# Patient Record
Sex: Female | Born: 1950 | Race: White | Hispanic: No | Marital: Married | Smoking: Current every day smoker
Health system: Southern US, Community
[De-identification: ages and names within clinical notes are randomized; demographics above are authoritative.]

## PROBLEM LIST (undated history)

## (undated) DIAGNOSIS — E039 Hypothyroidism, unspecified: Secondary | ICD-10-CM

## (undated) DIAGNOSIS — I10 Essential (primary) hypertension: Secondary | ICD-10-CM

## (undated) DIAGNOSIS — R7309 Other abnormal glucose: Secondary | ICD-10-CM

## (undated) HISTORY — PX: WISDOM TOOTH EXTRACTION: SHX21

## (undated) HISTORY — PX: OTHER SURGICAL HISTORY: SHX169

## (undated) HISTORY — DX: Other abnormal glucose: R73.09

## (undated) HISTORY — DX: Essential (primary) hypertension: I10

## (undated) HISTORY — DX: Hypothyroidism, unspecified: E03.9

---

## 2003-07-27 ENCOUNTER — Encounter: Admission: RE | Admit: 2003-07-27 | Discharge: 2003-07-27 | Payer: Self-pay | Admitting: Otolaryngology

## 2003-07-27 ENCOUNTER — Encounter: Payer: Self-pay | Admitting: Otolaryngology

## 2003-07-29 ENCOUNTER — Ambulatory Visit (HOSPITAL_COMMUNITY): Admission: RE | Admit: 2003-07-29 | Discharge: 2003-07-30 | Payer: Self-pay | Admitting: Otolaryngology

## 2003-07-29 ENCOUNTER — Encounter: Payer: Self-pay | Admitting: Otolaryngology

## 2003-08-01 ENCOUNTER — Ambulatory Visit (HOSPITAL_COMMUNITY): Admission: RE | Admit: 2003-08-01 | Discharge: 2003-08-01 | Payer: Self-pay | Admitting: Otolaryngology

## 2003-08-01 ENCOUNTER — Encounter: Payer: Self-pay | Admitting: Otolaryngology

## 2004-12-23 HISTORY — PX: SALIVARY GLAND SURGERY: SHX768

## 2009-02-02 ENCOUNTER — Emergency Department (HOSPITAL_COMMUNITY): Admission: EM | Admit: 2009-02-02 | Discharge: 2009-02-02 | Payer: Self-pay | Admitting: Emergency Medicine

## 2009-02-03 ENCOUNTER — Encounter: Admission: RE | Admit: 2009-02-03 | Discharge: 2009-02-03 | Payer: Self-pay | Admitting: Internal Medicine

## 2009-02-03 ENCOUNTER — Telehealth: Payer: Self-pay | Admitting: Family Medicine

## 2009-02-03 ENCOUNTER — Ambulatory Visit: Payer: Self-pay | Admitting: Internal Medicine

## 2009-02-03 DIAGNOSIS — I10 Essential (primary) hypertension: Secondary | ICD-10-CM | POA: Insufficient documentation

## 2009-02-03 DIAGNOSIS — R7309 Other abnormal glucose: Secondary | ICD-10-CM

## 2009-02-04 LAB — CONVERTED CEMR LAB
Free T4: 0.8 ng/dL (ref 0.6–1.6)
Hgb A1c MFr Bld: 6.3 % — ABNORMAL HIGH (ref 4.6–6.0)
Total CK: 46 units/L (ref 7–177)

## 2009-02-06 ENCOUNTER — Encounter (INDEPENDENT_AMBULATORY_CARE_PROVIDER_SITE_OTHER): Payer: Self-pay | Admitting: *Deleted

## 2009-02-13 ENCOUNTER — Ambulatory Visit: Payer: Self-pay | Admitting: Internal Medicine

## 2009-02-13 DIAGNOSIS — F411 Generalized anxiety disorder: Secondary | ICD-10-CM | POA: Insufficient documentation

## 2009-02-13 DIAGNOSIS — R7309 Other abnormal glucose: Secondary | ICD-10-CM

## 2009-02-17 ENCOUNTER — Telehealth (INDEPENDENT_AMBULATORY_CARE_PROVIDER_SITE_OTHER): Payer: Self-pay | Admitting: *Deleted

## 2009-02-20 ENCOUNTER — Telehealth (INDEPENDENT_AMBULATORY_CARE_PROVIDER_SITE_OTHER): Payer: Self-pay | Admitting: *Deleted

## 2009-02-21 ENCOUNTER — Ambulatory Visit: Payer: Self-pay | Admitting: Internal Medicine

## 2009-02-21 DIAGNOSIS — R5383 Other fatigue: Secondary | ICD-10-CM

## 2009-02-21 DIAGNOSIS — R110 Nausea: Secondary | ICD-10-CM

## 2009-02-21 DIAGNOSIS — R5381 Other malaise: Secondary | ICD-10-CM

## 2009-02-21 DIAGNOSIS — R6883 Chills (without fever): Secondary | ICD-10-CM | POA: Insufficient documentation

## 2009-02-21 DIAGNOSIS — R198 Other specified symptoms and signs involving the digestive system and abdomen: Secondary | ICD-10-CM

## 2009-02-23 LAB — CONVERTED CEMR LAB
Albumin: 4.6 g/dL (ref 3.5–5.2)
Alkaline Phosphatase: 71 units/L (ref 39–117)
Basophils Relative: 0.2 % (ref 0.0–3.0)
Creatinine, Ser: 0.7 mg/dL (ref 0.4–1.2)
Eosinophils Absolute: 0.2 10*3/uL (ref 0.0–0.7)
Free T4: 0.8 ng/dL (ref 0.6–1.6)
HCT: 42.8 % (ref 36.0–46.0)
Hemoglobin: 14.8 g/dL (ref 12.0–15.0)
Lymphocytes Relative: 23.9 % (ref 12.0–46.0)
MCHC: 34.7 g/dL (ref 30.0–36.0)
Monocytes Absolute: 0.9 10*3/uL (ref 0.1–1.0)
Monocytes Relative: 10.3 % (ref 3.0–12.0)
Potassium: 3.7 meq/L (ref 3.5–5.1)
TSH: 4.75 microintl units/mL (ref 0.35–5.50)
WBC: 8.5 10*3/uL (ref 4.5–10.5)

## 2009-02-24 ENCOUNTER — Encounter (INDEPENDENT_AMBULATORY_CARE_PROVIDER_SITE_OTHER): Payer: Self-pay | Admitting: *Deleted

## 2009-02-24 ENCOUNTER — Telehealth (INDEPENDENT_AMBULATORY_CARE_PROVIDER_SITE_OTHER): Payer: Self-pay | Admitting: *Deleted

## 2009-02-28 ENCOUNTER — Telehealth: Payer: Self-pay | Admitting: Internal Medicine

## 2009-03-03 ENCOUNTER — Telehealth: Payer: Self-pay | Admitting: Internal Medicine

## 2009-04-17 ENCOUNTER — Telehealth: Payer: Self-pay | Admitting: Internal Medicine

## 2009-04-17 ENCOUNTER — Encounter: Payer: Self-pay | Admitting: Internal Medicine

## 2009-04-19 ENCOUNTER — Encounter: Payer: Self-pay | Admitting: Internal Medicine

## 2009-06-16 ENCOUNTER — Ambulatory Visit: Payer: Self-pay | Admitting: Internal Medicine

## 2009-06-19 ENCOUNTER — Encounter: Payer: Self-pay | Admitting: Internal Medicine

## 2009-06-27 ENCOUNTER — Encounter (INDEPENDENT_AMBULATORY_CARE_PROVIDER_SITE_OTHER): Payer: Self-pay | Admitting: *Deleted

## 2009-07-22 IMAGING — CR DG CHEST 2V
2 series · 2 of 2 positions shown · non-contrast
Comparison: None

CLINICAL DATA: Chest pain.

CHEST - 2 VIEW

[w chest pa]
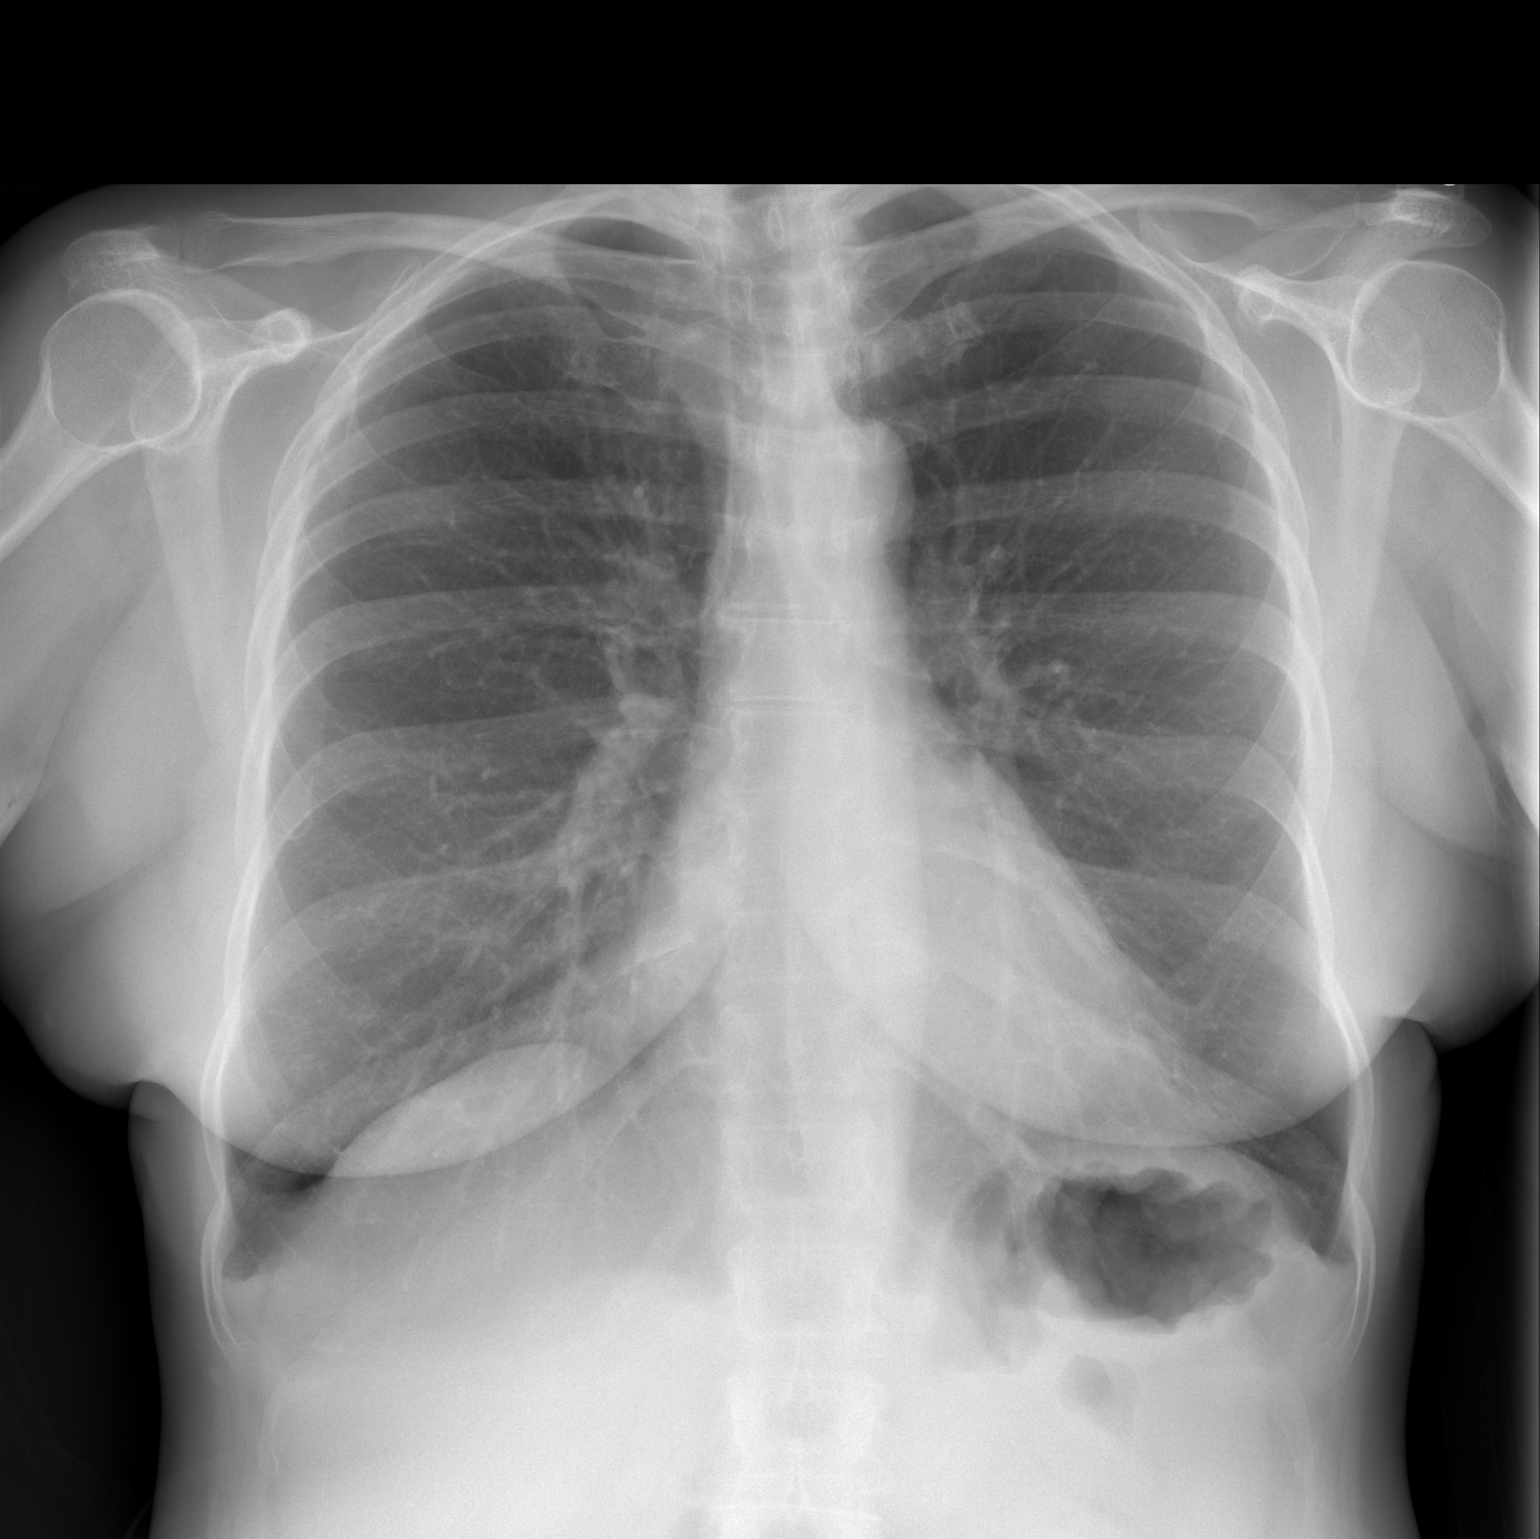

[w chest lat]
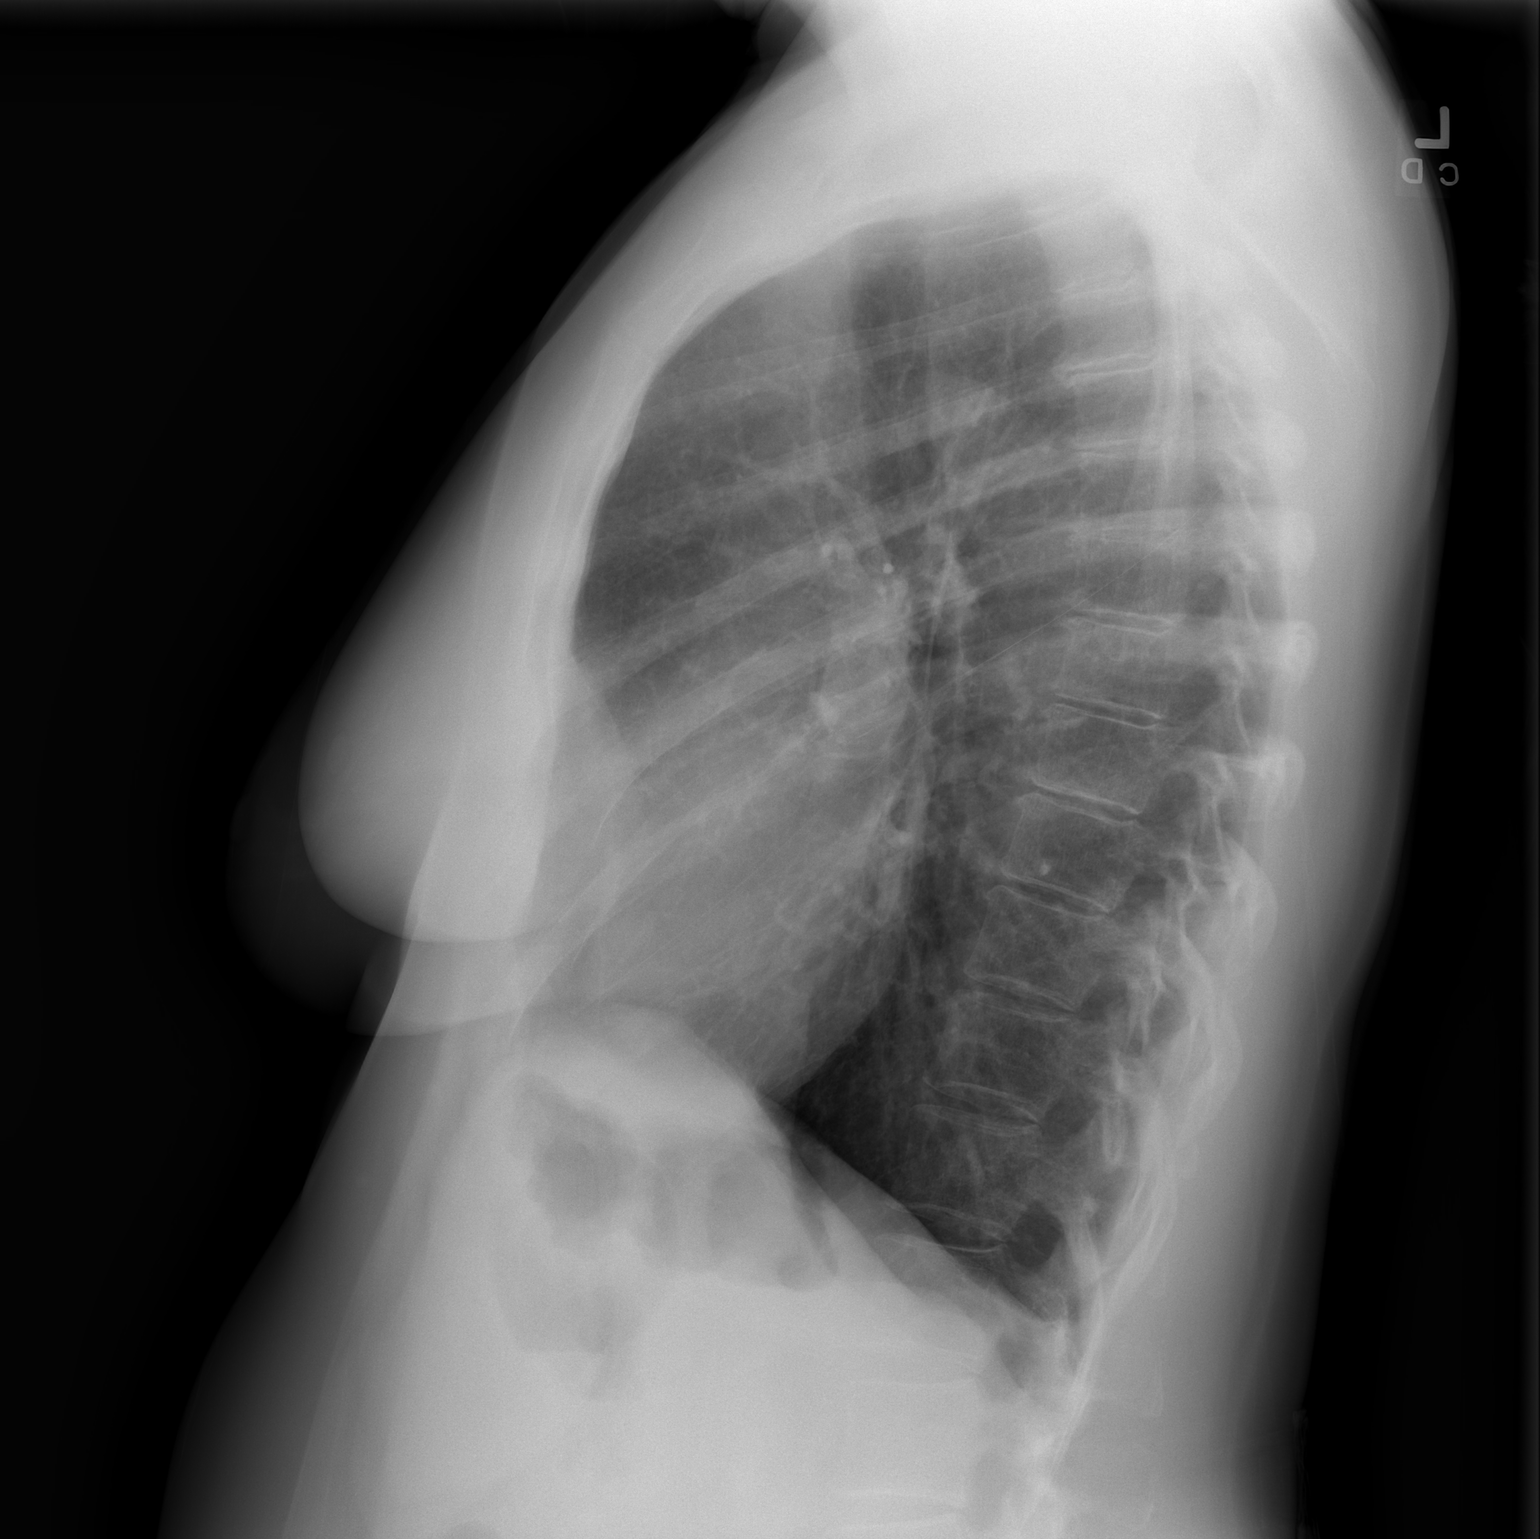

[2 of 2 positions shown; findings below may reference images not displayed]

FINDINGS: Heart and mediastinal contours are within normal limits.
No focal opacities or effusions.  No acute bony abnormality.
IMPRESSION: No active disease.

REF:Z9 DICTATED: 02/02/2009 [DATE]

## 2009-07-23 IMAGING — CT CT ANGIO CHEST
3 of 5 series · 19 of 36 positions shown · IV contrast (CONTRAST)
Comparison: Chest x-ray dated 02/02/2009

CLINICAL DATA: Chest pain.

CT ANGIOGRAPHY CHEST
TECHNIQUE: Multidetector CT imaging of the chest using the
standard protocol during bolus administration of intravenous
contrast. Multiplanar reconstructed images including MIPs were
obtained and reviewed to evaluate the vascular anatomy.
Contrast: 125 ml Optiray 350

[Series 6: arterial 2x2 · axial · arterial · 0.68mm/px · z∈[+919,+1124]mm · 15 of 94 slices shown]
[im 6/94  lung]
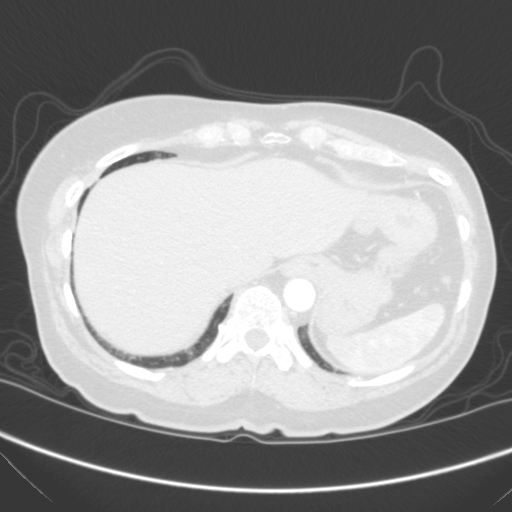
[im 12/94  mediastinal]
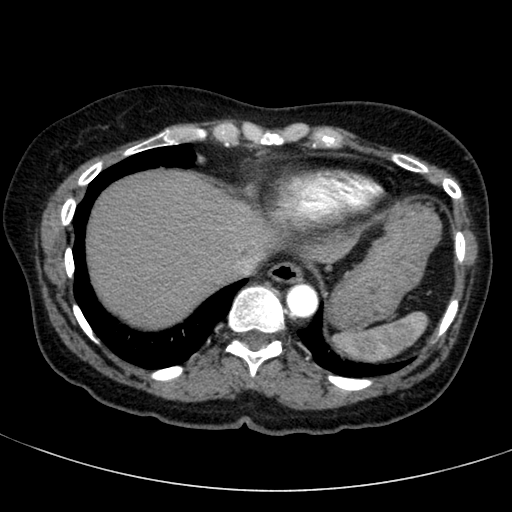
[im 18/94  lung]
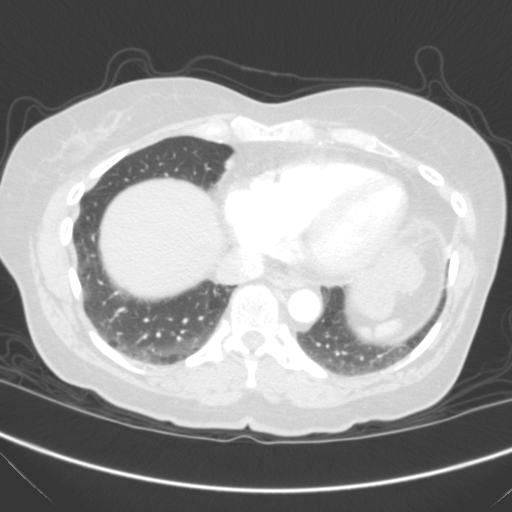
[im 24/94  mediastinal]
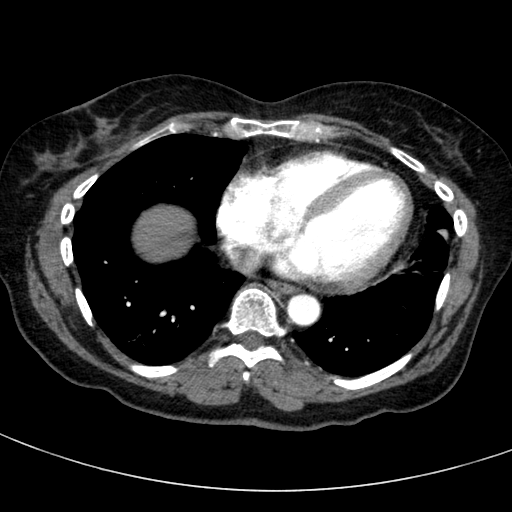
[im 30/94  lung]
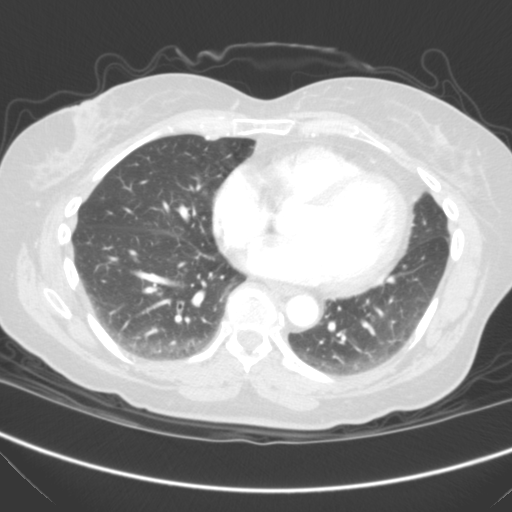
[im 35/94  mediastinal]
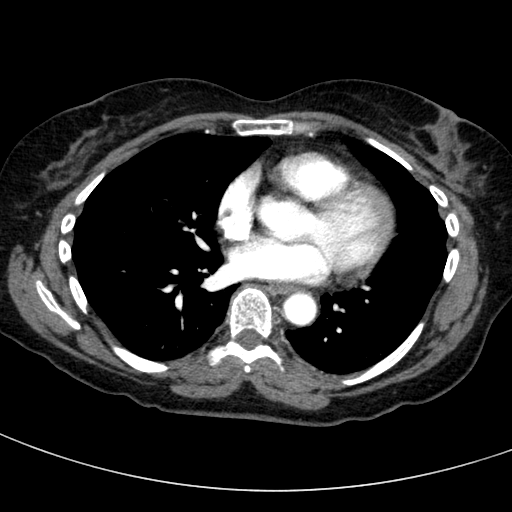
[im 41/94  lung]
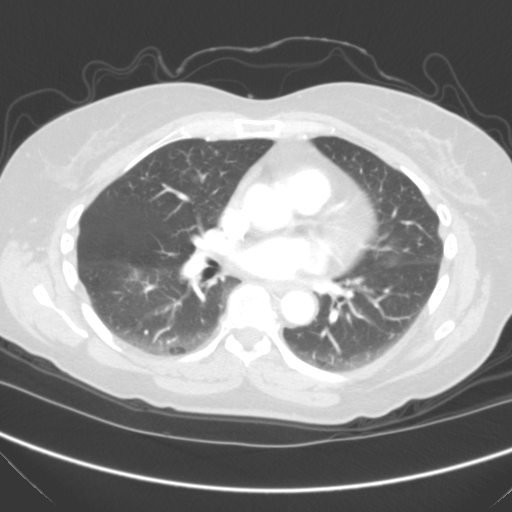
[im 47/94  mediastinal]
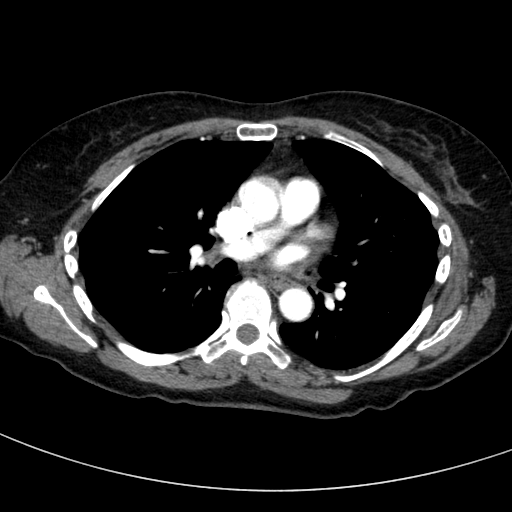
[im 53/94  lung]
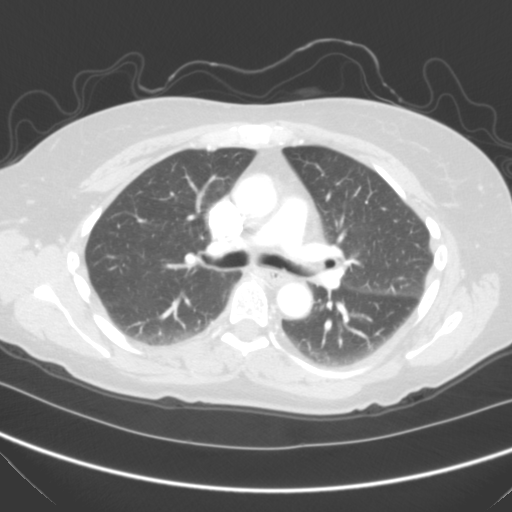
[im 59/94  mediastinal]
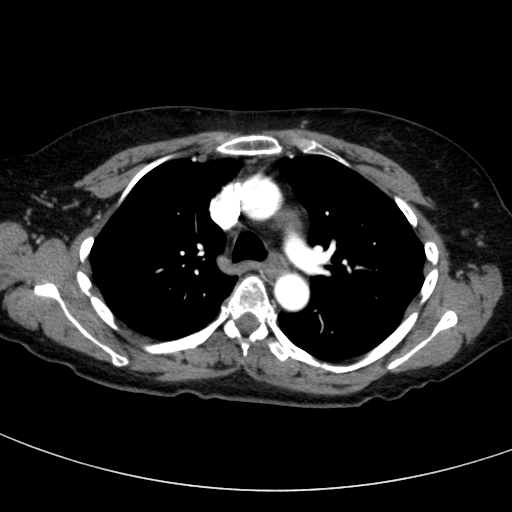
[im 64/94  lung]
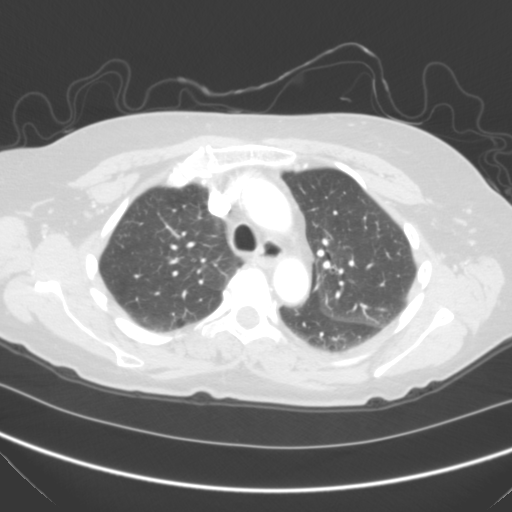
[im 70/94  mediastinal]
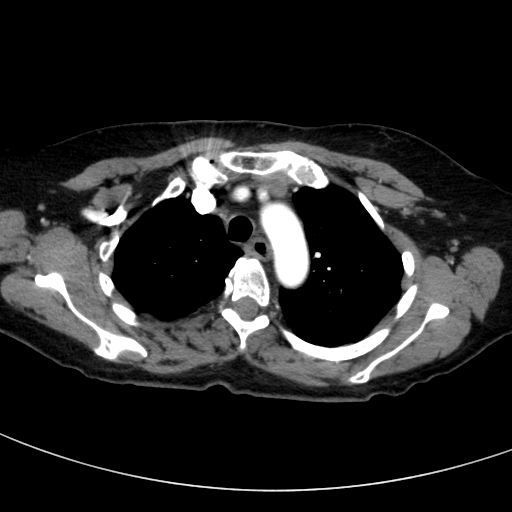
[im 76/94  lung]
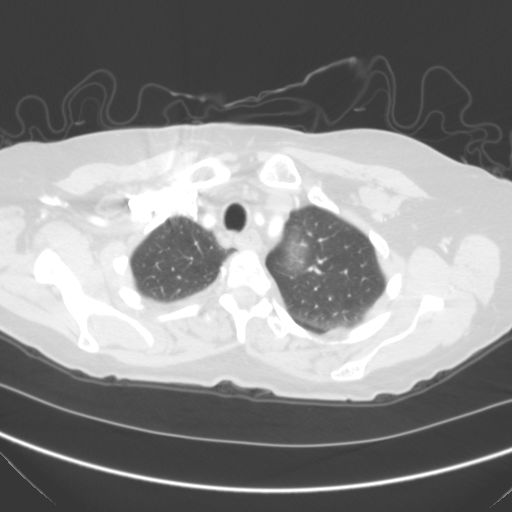
[im 82/94  mediastinal]
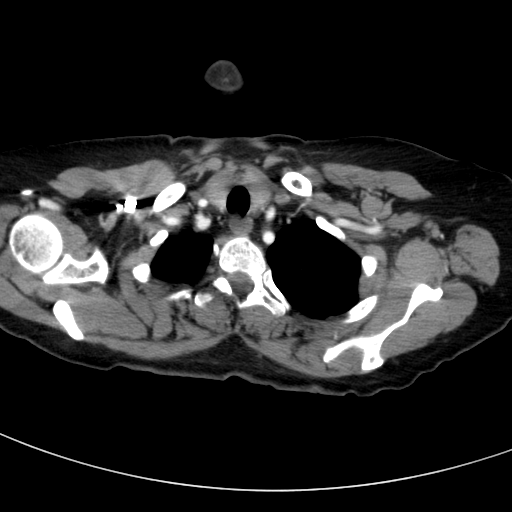
[im 88/94  lung]
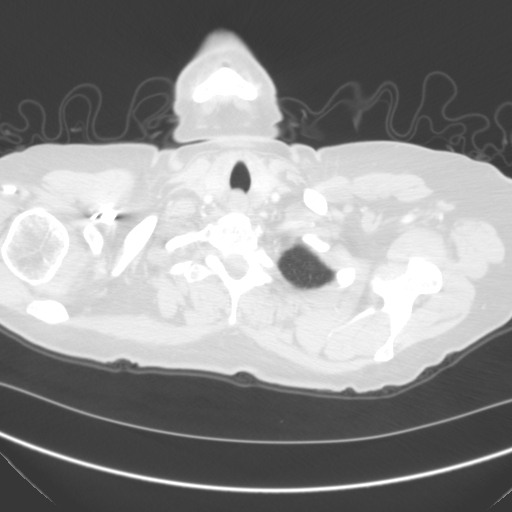

[Series 8: lung 5x5 · axial · 0.68mm/px · 1 of 47 slices shown]
[im 7/47  mediastinal]
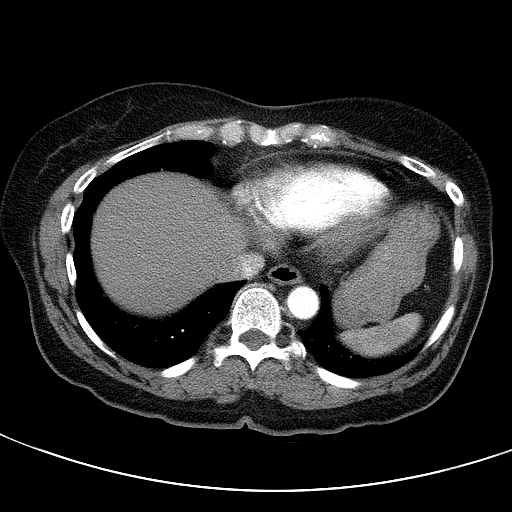

[coronals · coronal · 0.68mm/px · 3 of 84 slices shown]
[im 17/84  mediastinal]
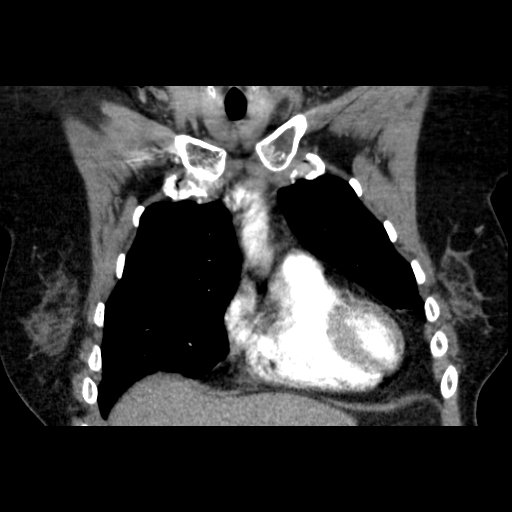
[im 34/84  mediastinal]
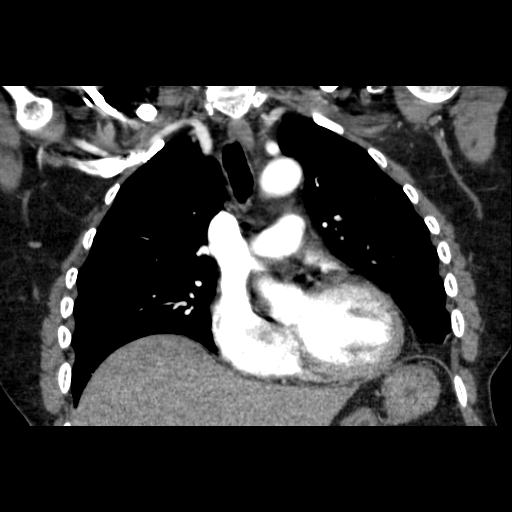
[im 50/84  mediastinal]
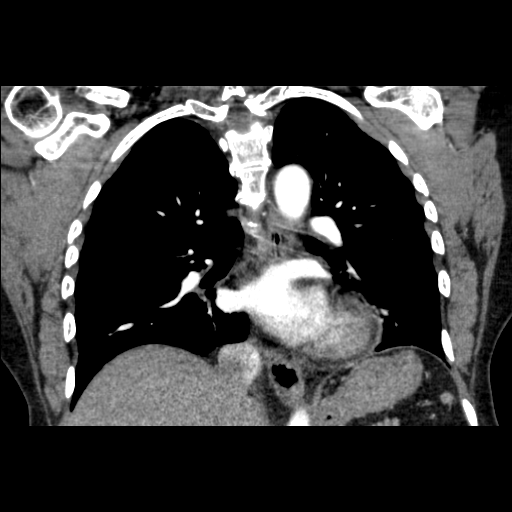

[19 of 36 positions shown; findings below may reference images not displayed]

FINDINGS: There is no evidence of pulmonary emboli, infiltrates,
effusions, or other acute abnormalities.  There are a few small
peripheral blebs in the posterior aspects of both lower lobes.

Heart size is normal.  The visualized portion of the upper abdomen
is normal.  No bony abnormality.
IMPRESSION: No significant abnormality.

REF:G3 DICTATED: 02/03/2009 [DATE]

## 2009-08-03 ENCOUNTER — Telehealth (INDEPENDENT_AMBULATORY_CARE_PROVIDER_SITE_OTHER): Payer: Self-pay | Admitting: *Deleted

## 2009-10-27 ENCOUNTER — Encounter: Payer: Self-pay | Admitting: Internal Medicine

## 2009-10-31 ENCOUNTER — Telehealth (INDEPENDENT_AMBULATORY_CARE_PROVIDER_SITE_OTHER): Payer: Self-pay | Admitting: *Deleted

## 2009-11-30 ENCOUNTER — Telehealth (INDEPENDENT_AMBULATORY_CARE_PROVIDER_SITE_OTHER): Payer: Self-pay | Admitting: *Deleted

## 2009-12-08 ENCOUNTER — Ambulatory Visit: Payer: Self-pay | Admitting: Internal Medicine

## 2009-12-10 LAB — CONVERTED CEMR LAB: Hgb A1c MFr Bld: 5.9 % (ref 4.6–6.5)

## 2009-12-11 ENCOUNTER — Encounter (INDEPENDENT_AMBULATORY_CARE_PROVIDER_SITE_OTHER): Payer: Self-pay | Admitting: *Deleted

## 2010-01-30 ENCOUNTER — Ambulatory Visit: Payer: Self-pay | Admitting: Internal Medicine

## 2010-01-30 DIAGNOSIS — K3189 Other diseases of stomach and duodenum: Secondary | ICD-10-CM | POA: Insufficient documentation

## 2010-01-30 DIAGNOSIS — R1013 Epigastric pain: Secondary | ICD-10-CM

## 2010-01-30 DIAGNOSIS — M545 Low back pain: Secondary | ICD-10-CM | POA: Insufficient documentation

## 2010-02-01 LAB — CONVERTED CEMR LAB
Basophils Absolute: 0 10*3/uL (ref 0.0–0.1)
Eosinophils Absolute: 0.1 10*3/uL (ref 0.0–0.7)
HCT: 45 % (ref 36.0–46.0)
Lymphs Abs: 1.4 10*3/uL (ref 0.7–4.0)
MCHC: 32.8 g/dL (ref 30.0–36.0)
Monocytes Relative: 9.1 % (ref 3.0–12.0)
Platelets: 326 10*3/uL (ref 150.0–400.0)
RDW: 12.5 % (ref 11.5–14.6)

## 2010-03-01 ENCOUNTER — Telehealth (INDEPENDENT_AMBULATORY_CARE_PROVIDER_SITE_OTHER): Payer: Self-pay | Admitting: *Deleted

## 2010-05-03 ENCOUNTER — Telehealth (INDEPENDENT_AMBULATORY_CARE_PROVIDER_SITE_OTHER): Payer: Self-pay | Admitting: *Deleted

## 2010-07-02 ENCOUNTER — Telehealth (INDEPENDENT_AMBULATORY_CARE_PROVIDER_SITE_OTHER): Payer: Self-pay | Admitting: *Deleted

## 2010-07-05 ENCOUNTER — Telehealth (INDEPENDENT_AMBULATORY_CARE_PROVIDER_SITE_OTHER): Payer: Self-pay | Admitting: *Deleted

## 2010-10-02 ENCOUNTER — Telehealth (INDEPENDENT_AMBULATORY_CARE_PROVIDER_SITE_OTHER): Payer: Self-pay | Admitting: *Deleted

## 2010-12-23 HISTORY — PX: CATARACT EXTRACTION: SUR2

## 2011-01-20 LAB — CONVERTED CEMR LAB
BUN: 13 mg/dL (ref 6–23)
Creatinine,U: 101.7 mg/dL
Hgb A1c MFr Bld: 6.1 % (ref 4.6–6.5)
Microalb, Ur: 0.6 mg/dL (ref 0.0–1.9)
Potassium: 3.9 meq/L (ref 3.5–5.1)

## 2011-01-22 NOTE — Progress Notes (Signed)
Summary: LORAZEPAM REFILL  Phone Note Refill Request Message from:  Fax from Pharmacy on October 02, 2010 1:03 PM  Refills Requested: Medication #1:  LORAZEPAM 1 MG TABS 1/2 -1 q 8 -12 hrs as needed stress   [facility] [address] [place]   FAX [phone]  Initial call taken by: Jerolyn Shin,  October 02, 2010 1:03 PM    Prescriptions: LORAZEPAM 1 MG TABS (LORAZEPAM) 1/2 -1 q 8 -12 hrs as needed stress  #30 x 0   Entered by:   Shonna Chock CMA   Authorized by:   Marga Melnick MD   Signed by:   Shonna Chock CMA on 10/02/2010   Method used:   Printed then faxed to ...       [facility]  [phone]* (retail)       [address] [place], [place]  [address]       Ph: [phone] or [phone]       Fax: [phone]   RxID:   [id number]

## 2011-01-22 NOTE — Progress Notes (Signed)
Summary: Refill Request  Phone Note Refill Request Call back at [phone] Message from:  Pharmacy on July 05, 2010 7:52 AM  Refills Requested: Medication #1:  LORAZEPAM 1 MG TABS 1/2 -1 q 8 -12 hrs as needed stress   Dosage confirmed as above?Dosage Confirmed   Supply Requested: 1 month   [facility] [address]  Next Appointment Scheduled: none Initial call taken by: Lavell Islam,  July 05, 2010 7:53 AM    Prescriptions: LORAZEPAM 1 MG TABS (LORAZEPAM) 1/2 -1 q 8 -12 hrs as needed stress  #30 x 0   Entered by:   Shonna Chock CMA   Authorized by:   Marga Melnick MD   Signed by:   Shonna Chock CMA on 07/05/2010   Method used:   Printed then faxed to ...       [facility]  [phone]* (retail)       [address] [place], [place]  [address]       Ph: [phone] or [phone]       Fax: [phone]   RxID:   [id number]

## 2011-01-22 NOTE — Progress Notes (Signed)
Summary: losartan refill   Phone Note Refill Request Message from:  Fax from Pharmacy on July 02, 2010 4:16 PM  Refills Requested: Medication #1:  LOSARTAN POTASSIUM 100 MG TABS 1 by mouth once daily [facility] - fax [phone] - ph [phone]  Initial call taken by: Okey Regal Spring,  July 02, 2010 4:17 PM    Prescriptions: LOSARTAN POTASSIUM 100 MG TABS (LOSARTAN POTASSIUM) 1 by mouth once daily  #30 Tablet x 4   Entered by:   Doristine Devoid CMA   Authorized by:   Marga Melnick MD   Signed by:   Doristine Devoid CMA on 07/03/2010   Method used:   Electronically to        [facility]  [phone]* (retail)       [address] [place], [place]  [address]       Ph: [phone] or [phone]       Fax: [phone]   RxID:   [id number]

## 2011-01-22 NOTE — Progress Notes (Signed)
Summary: Refill Request  Phone Note Refill Request Call back at [phone] Message from:  Pharmacy on May 03, 2010 2:13 PM  Refills Requested: Medication #1:  LORAZEPAM 1 MG TABS 1/2 -1 q 8 -12 hrs as needed stress   Dosage confirmed as above?Dosage Confirmed   Supply Requested: 1 month   [facility]  Next Appointment Scheduled: none Initial call taken by: Harold Barban,  May 03, 2010 2:13 PM    Prescriptions: LORAZEPAM 1 MG TABS (LORAZEPAM) 1/2 -1 q 8 -12 hrs as needed stress  #30 x 0   Entered by:   Shonna Chock   Authorized by:   Marga Melnick MD   Signed by:   Shonna Chock on 05/03/2010   Method used:   Printed then faxed to ...       [facility]  [phone]* (retail)       [address] [place], [place]  [address]       Ph: [phone] or [phone]       Fax: [phone]   RxID:   [id number]

## 2011-01-22 NOTE — Assessment & Plan Note (Signed)
Summary: PAIN ON LEFT SIDE/KDC   Vital Signs:  Patient profile:   60 year old female Weight:      173.8 pounds Temp:     99.2 degrees F oral Pulse rate:   80 / minute Resp:     17 per minute BP sitting:   116 / 70  (left arm) Cuff size:   large  Vitals Entered By: Shonna Chock (January 30, 2010 12:18 PM) CC: Left side lower back/hip pain since Saturday. No appetite x 24hours and more gas/indigestion x 1 year Comments REVIEWED MED LIST, PATIENT AGREED DOSE AND INSTRUCTION CORRECT    CC:  Left side lower back/hip pain since Saturday. No appetite x 24hours and more gas/indigestion x 1 year.  History of Present Illness: Onset L LS area dull , burning  pain upon awakening 01/27/2010; slight radiation to L groin , initially intermittent but constant as of 02/06. Rx: heating pad, NSAIDS w/o help. Tramadol X 2  helped; she forgot she had it.No  PMH of back injury or issues. Dyspepsia X 1 year ; worse since 02/04 before Advil taken.WV:PXTGGY helped. No PMH of colonoscopy; "I never had a doctor until last year". No FH of GI disease. No Gyn monitor for 8 years.  Allergies: 1)  ! Celexa (Citalopram Hydrobromide)  Review of Systems General:  Complains of chills; denies fever and sweats. GI:  Complains of constipation, loss of appetite, and nausea; denies bloody stools, dark tarry stools, diarrhea, vomiting, and yellowish skin color; No  light stool ; no bloating. GU:  Denies abnormal vaginal bleeding, discharge, dysuria, and hematuria; No coke colored urine. MS:  Complains of joint pain and low back pain; denies joint redness and joint swelling. Derm:  Denies lesion(s) and rash. Neuro:  Denies brief paralysis, numbness, tingling, and weakness. Heme:  Denies abnormal bruising and bleeding.  Physical Exam  General:  well-nourished,in no acute distress; alert,appropriate and cooperative throughout examination; appears mildly uncomfortable-appearing.   Eyes:  No corneal or conjunctival  inflammation noted. No icterus.  Mouth:  Oral mucosa and oropharynx without lesions or exudates.  Teeth in good repair.No pharyngeal erythema.  Osteoma of hard palate Heart:  Normal rate and regular rhythm. S1 and S2 normal without gallop, murmur, click, rub. S4 Abdomen:  Bowel sounds positive,abdomen soft  slightly tender  LLQ without masses, organomegaly or hernias noted.No AAA Rectal:  Stool cards given Genitalia:  Gyn F/U recommended Msk:  No flank tenderness; sat up w/o help Extremities:  Neg SLR  but some discomfort L LS area; no arthritic hand changes Neurologic:  alert & oriented X3, strength normal in all extremities, heel & tiptoe gait normal, and DTRs symmetrical and normal.   Skin:  Intact without suspicious lesions or rashes. No jaundice Cervical Nodes:  No lymphadenopathy noted Axillary Nodes:  No palpable lymphadenopathy Psych:  Oriented X3, normally interactive, and good eye contact.     Impression & Recommendations:  Problem # 1:  LOW BACK PAIN, ACUTE (ICD-724.2)  Her updated medication list for this problem includes:    Tramadol Hcl 50 Mg Tabs (Tramadol hcl) .Marland Kitchen... 1-2 q 4-6 hrs as needed    Cyclobenzaprine Hcl 5 Mg Tabs (Cyclobenzaprine hcl) .Marland Kitchen... 1-2 at bedtime for back pain  Orders: Venipuncture (69485) TLB-CBC Platelet - w/Differential (85025-CBCD) TLB-Udip ONLY (81003-UDIP)  Problem # 2:  DYSPEPSIA (ICD-536.8)  Orders: Venipuncture (46270) TLB-CBC Platelet - w/Differential (85025-CBCD)  Complete Medication List: 1)  Losartan Potassium 100 Mg Tabs (Losartan potassium) .Marland Kitchen.. 1 by mouth  once daily 2)  Tramadol Hcl 50 Mg Tabs (Tramadol hcl) .Marland Kitchen.. 1-2 q 4-6 hrs as needed 3)  Lorazepam 1 Mg Tabs (Lorazepam) .... 1/2 -1 q 8 -12 hrs as needed stress 4)  Cyclobenzaprine Hcl 5 Mg Tabs (Cyclobenzaprine hcl) .Marland Kitchen.. 1-2 at bedtime for back pain  Patient Instructions: 1)  Avoid foods high in acid (tomatoes, citrus juices, spicy foods). Avoid eating within two hours of  lying down or before exercising. Do not over eat; try smaller more frequent meals. Elevate head of bed twelve inches when sleeping. Complete stool cards. Prilosec OTC 30 minutes pre b'fast. 2)  Please consider scheduling  a colonoscopy  to help detect colon cancer. Please consider Gyn survelliance; consider Dr Rosalio Macadamia 's group for example Prescriptions: CYCLOBENZAPRINE HCL 5 MG TABS (CYCLOBENZAPRINE HCL) 1-2 at bedtime for back pain  #20 x 0   Entered and Authorized by:   Marga Melnick MD   Signed by:   Marga Melnick MD on 01/30/2010   Method used:   Print then Give to Patient   RxID:   [id number] TRAMADOL HCL 50 MG TABS (TRAMADOL HCL) 1-2 q 4-6 hrs as needed  #30 x 2   Entered and Authorized by:   Marga Melnick MD   Signed by:   Marga Melnick MD on 01/30/2010   Method used:   Print then Give to Patient   RxID:   [id number]   Appended Document: PAIN ON LEFT SIDE/KDC  Laboratory Results   Urine Tests   Date/Time Reported: January 30, 2010 1:23 PM   Routine Urinalysis   Color: yellow Appearance: Clear Glucose: negative   (Normal Range: Negative) Bilirubin: negative   (Normal Range: Negative) Ketone: negative   (Normal Range: Negative) Spec. Gravity: <1.005   (Normal Range: 1.003-1.035) Blood: moderate   (Normal Range: Negative) pH: 5.0   (Normal Range: 5.0-8.0) Protein: negative   (Normal Range: Negative) Urobilinogen: 0.2   (Normal Range: 0-1) Nitrite: negative   (Normal Range: Negative) Leukocyte Esterace: negative   (Normal Range: Negative)    Comments: Floydene Flock  January 30, 2010 1:24 PM cx sent

## 2011-01-22 NOTE — Progress Notes (Signed)
Summary: refill  Phone Note Refill Request Message from:  Fax from Pharmacy on March 01, 2010 12:11 PM  Refills Requested: Medication #1:  LORAZEPAM 1 MG TABS 1/2 -1 q 8 -12 hrs as needed stress [facility] [phone]   Method Requested: Fax to Local Pharmacy Next Appointment Scheduled: no appt Initial call taken by: Barb Merino,  March 01, 2010 12:11 PM    Prescriptions: LORAZEPAM 1 MG TABS (LORAZEPAM) 1/2 -1 q 8 -12 hrs as needed stress  #30 x 0   Entered by:   Shonna Chock   Authorized by:   Marga Melnick MD   Signed by:   Shonna Chock on 03/01/2010   Method used:   Printed then faxed to ...       [facility]  [phone]* (retail)       [address] [place], [place]  [address]       Ph: [phone] or [phone]       Fax: [phone]   RxID:   [id number]

## 2011-01-29 ENCOUNTER — Telehealth (INDEPENDENT_AMBULATORY_CARE_PROVIDER_SITE_OTHER): Payer: Self-pay | Admitting: *Deleted

## 2011-01-31 ENCOUNTER — Encounter (INDEPENDENT_AMBULATORY_CARE_PROVIDER_SITE_OTHER): Payer: Self-pay | Admitting: *Deleted

## 2011-02-07 NOTE — Letter (Signed)
Summary: Primary Care Appointment Letter  [facility] [place]  [address] [place], [place] [address]   Phone: [phone]  Fax: [phone]    01/31/2011 MRN: [id number]  Brandi Todd [address] [place], [place]  [address]  Dear Brandi Todd,   Your Primary Care Physician Marga Melnick MD has indicated that:    ___X____it is time to schedule an appointment(Please call to schedule a yearly appointment) This is necessary to continue refilling meds .    _______you missed your appointment on______ and need to call and          reschedule.    _______you need to have lab work done.    _______you need to schedule an appointment discuss lab or test results.    _______you need to call to reschedule your appointment that is                       scheduled on _________.     Please call our office as soon as possible. Our phone number is [phone]. Please press option 1. Our office is open 8a-5p, Monday through Friday.     Thank you,    [facility] Scheduler

## 2011-02-07 NOTE — Letter (Signed)
Summary: Results Follow up Letter  [facility] [place]  [address] [place], [place] [address]   Phone: [phone]  Fax: [phone]    01/31/2011 MRN: [id number]  Brandi Todd [address] [place], [place]  [address]  Dear Brandi Todd,  The following are the results of your recent test(s):  Test         Result    Pap Smear:        Normal _____  Not Normal _____ Comments: ______________________________________________________ Cholesterol: LDL(Bad cholesterol):         Your goal is less than:         HDL (Good cholesterol):       Your goal is more than: Comments:  ______________________________________________________ Mammogram:        Normal _____  Not Normal _____ Comments:  ___________________________________________________________________ Hemoccult:        Normal __X___  Not normal _______ Comments:    _____________________________________________________________________ Other Tests:    We routinely do not discuss normal results over the telephone.  If you desire a copy of the results, or you have any questions about this information we can discuss them at your next office visit.   Sincerely,     Appended Document: Results Follow up Letter Note voided, created in error

## 2011-02-07 NOTE — Progress Notes (Signed)
Summary: refill  Phone Note Refill Request Message from:  Fax from Pharmacy on January 29, 2011 2:27 PM  Refills Requested: Medication #1:  LORAZEPAM 1 MG TABS 1/2 -1 q 8 -12 hrs as needed stress [facility] fax [phone] -- phone [phone]  Initial call taken by: Okey Regal Spring,  January 29, 2011 2:28 PM    New/Updated Medications: LORAZEPAM 1 MG TABS (LORAZEPAM) 1/2 -1 q 8 -12 hrs as needed stress**APPOINTMENT DUE** Prescriptions: LORAZEPAM 1 MG TABS (LORAZEPAM) 1/2 -1 q 8 -12 hrs as needed stress**APPOINTMENT DUE**  #30 x 0   Entered by:   Shonna Chock CMA   Authorized by:   Marga Melnick MD   Signed by:   Shonna Chock CMA on 01/29/2011   Method used:   Printed then faxed to ...       [facility]  [phone]* (retail)       [address] [place], [place]  [address]       Ph: [phone] or [phone]       Fax: [phone]   RxID:   [id number]

## 2011-02-27 ENCOUNTER — Encounter: Payer: Self-pay | Admitting: Internal Medicine

## 2011-04-09 LAB — COMPREHENSIVE METABOLIC PANEL
ALT: 17 U/L (ref 0–35)
AST: 17 U/L (ref 0–37)
Albumin: 4.6 g/dL (ref 3.5–5.2)
Alkaline Phosphatase: 77 U/L (ref 39–117)
Potassium: 3.3 mEq/L — ABNORMAL LOW (ref 3.5–5.1)
Sodium: 136 mEq/L (ref 135–145)
Total Protein: 7.3 g/dL (ref 6.0–8.3)

## 2011-04-09 LAB — CBC
Hemoglobin: 14.6 g/dL (ref 12.0–15.0)
Platelets: 369 10*3/uL (ref 150–400)
RDW: 13.1 % (ref 11.5–15.5)

## 2011-04-09 LAB — DIFFERENTIAL
Basophils Relative: 0 % (ref 0–1)
Eosinophils Absolute: 0.1 10*3/uL (ref 0.0–0.7)
Monocytes Absolute: 0.9 10*3/uL (ref 0.1–1.0)
Monocytes Relative: 8 % (ref 3–12)

## 2011-04-09 LAB — TSH: TSH: 2.588 u[IU]/mL (ref 0.350–4.500)

## 2011-04-12 ENCOUNTER — Encounter: Payer: Self-pay | Admitting: Internal Medicine

## 2011-04-12 ENCOUNTER — Ambulatory Visit (INDEPENDENT_AMBULATORY_CARE_PROVIDER_SITE_OTHER): Payer: BC Managed Care – PPO | Admitting: Internal Medicine

## 2011-04-12 DIAGNOSIS — Z0000 Encounter for general adult medical examination without abnormal findings: Secondary | ICD-10-CM

## 2011-04-12 DIAGNOSIS — Z23 Encounter for immunization: Secondary | ICD-10-CM

## 2011-04-12 DIAGNOSIS — R7309 Other abnormal glucose: Secondary | ICD-10-CM

## 2011-04-12 DIAGNOSIS — I10 Essential (primary) hypertension: Secondary | ICD-10-CM

## 2011-04-12 MED ORDER — TETANUS-DIPHTH-ACELL PERTUSSIS 5-2.5-18.5 LF-MCG/0.5 IM SUSP
0.5000 mL | Freq: Once | INTRAMUSCULAR | Status: AC
  Administered 2011-04-12: 0.5 mL via INTRAMUSCULAR

## 2011-04-12 NOTE — Assessment & Plan Note (Addendum)
Controlled with diet & exercise; A1c dropped from 6.3 to 5.9%

## 2011-04-12 NOTE — Patient Instructions (Addendum)
Preventive Health Care: Exercise  30-45  minutes a day, 3-4 days a week. Walking is especially valuable in preventing Osteoporosis. Eat a low-fat diet with lots of fruits and vegetables, up to 7-9 servings per day. Avoid obesity; your goal = waist less than 35 inches.Consume less than 30 grams of sugar per day from foods & drinks with High Fructose Corn Syrup as #2,3 or #4 on label. Eye Doctor - have an eye exam @ least annually Alcohol If you drink, do it moderately - 1 drink per day or less.  Depression is common in our stressful world. If you're feeling down or losing interest in things you normally enjoy, please call. Please think about quitting smoking. Review the risks we discussed. Please call 1-800-QUIT-NOW (782)504-3700) for free smoking cessation counseling.   Please have the dental caries addressed; this could be a threat to your health long term. Please consider a screening colonoscopy based on the standard of care we discussed. I do recommend followup with the gynecologist at least every 2 years. Monthly self exams and annual mammograms would be recommended.  Please schedule the following fasting labs: BMET, CBC and differential, hepatic panel, lipids, TSH, and A1 C. Please complete stool cards.  The EKG does not show significant hypertensive changes. It does show premature ventricular contractions. You should avoid excess stimulants such as decongestants, diet pills, nicotine, or caffeine ( coffee, tea, cola,chocolate).

## 2011-04-12 NOTE — Progress Notes (Signed)
Subjective:    Patient ID: Brandi Todd, female    DOB: 1951-06-10, 60 y.o.   MRN: [id number]  HPI She is here for a physical; she has has some seasonally related symptoms.    Review of Systems Patient reports no vision/ hearing  changes, adenopathy,fever, weight change,  persistant / recurrent hoarseness , swallowing issues, chest pain,palpitations,edema,persistant /recurrent cough, hemoptysis, dyspnea( rest/ exertional/paroxysmal nocturnal), gastrointestinal bleeding(melena, rectal bleeding), abdominal pain, significant heartburn, bowel changes,GU symptoms(dysuria, hematuria,pyuria, incontinence ), Gyn symptoms(abnormal  bleeding , pain),  syncope, focal weakness, memory loss,numbness & tingling, skin/hair /nail changes,abnormal bruising or bleeding, anxiety,or depression.   She describes overactive bowel sounds his "growling" but w/o other GI symptoms as noted above.  She is not completing surveillance and preventive care such as mammograms. She's not had a colonoscopy. The standard of care was reviewed.     Objective:   Physical Exam Gen.: Healthy and well-nourished in appearance. Alert, appropriate and cooperative throughout exam. Head: Normocephalic without obvious abnormalities Eyes: No corneal or conjunctival inflammation noted. Pupils equal round reactive to light and accommodation. Fundal exam is benign without hemorrhages, exudate, papilledema. Extraocular motion intact.  OD  Vision &  Light reflex  grossly decreased . Ears: External  ear exam reveals no significant lesions or deformities. Canals clear .TMs normal. Hearing is grossly normal bilaterally. Nose: External nasal exam reveals no deformity or inflammation. Nasal mucosa are pink and moist. No lesions or exudates noted.  Mouth: Oral mucosa and oropharynx reveal no lesions or exudates. Teeth in poor repair; molar caries. Neck: No deformities, masses, or tenderness noted. Range of motion  normal. Thyroid  w/o  nodules. Lungs: Normal respiratory effort; chest expands symmetrically. Lungs are clear to auscultation without rales, wheezes, or increased work of breathing. BS slightly decreased Heart: Normal rate and rhythm. Normal S1 and S2. No gallop, click, or rub. No murmur. S4 with slight slurring Abdomen: Bowel sounds normal; abdomen soft and nontender. No masses, organomegaly or hernias noted. Genitalia: Dr Particia Jasper group                                                                                     Musculoskeletal/extremities: No deformity or scoliosis noted of  the thoracic or lumbar spine. No clubbing, cyanosis, edema, or deformity noted. Range of motion  normal .Tone & strength  normal.Joints normal. Nail health  Good. Minor crepitus of knees Vascular: Carotid, radial artery, dorsalis pedis and dorsalis posterior tibial pulses are full and equal. No bruits present. Neurologic: Alert and oriented x3. Deep tendon reflexes symmetrical and normal.         Skin: Intact without suspicious lesions or rashes. Lymph: No cervical, axillary, or inguinal lymphadenopathy present. Psych: Mood and affect are normal. Normally interactive  Assessment & Plan:  #1 Comprehensive  physical reveals no significant issues  #2 hypertension controlled  #3 past medical history diabetes; this resolved with lifestyle change.  #4 smoking; consumption has decreased.  #5 early cataract in the right eye suggested clinically  #6 significant caries  #7 incomplete preventive care such as mammograms and colonoscopy.   recommendations: #1 labs should be done to assess the diabetes status and volume status in the context of hypertension.  # 2 Preventive interventions are recommended.

## 2011-04-18 ENCOUNTER — Encounter: Payer: Self-pay | Admitting: Internal Medicine

## 2011-04-18 ENCOUNTER — Other Ambulatory Visit (INDEPENDENT_AMBULATORY_CARE_PROVIDER_SITE_OTHER): Payer: BC Managed Care – PPO

## 2011-04-18 DIAGNOSIS — E119 Type 2 diabetes mellitus without complications: Secondary | ICD-10-CM

## 2011-04-18 DIAGNOSIS — R7309 Other abnormal glucose: Secondary | ICD-10-CM

## 2011-04-18 DIAGNOSIS — Z0000 Encounter for general adult medical examination without abnormal findings: Secondary | ICD-10-CM

## 2011-04-18 DIAGNOSIS — I10 Essential (primary) hypertension: Secondary | ICD-10-CM

## 2011-04-18 LAB — CBC WITH DIFFERENTIAL/PLATELET
Basophils Absolute: 0 10*3/uL (ref 0.0–0.1)
Eosinophils Relative: 8.2 % — ABNORMAL HIGH (ref 0.0–5.0)
Hemoglobin: 15.3 g/dL — ABNORMAL HIGH (ref 12.0–15.0)
Lymphocytes Relative: 36.7 % (ref 12.0–46.0)
Monocytes Relative: 13.3 % — ABNORMAL HIGH (ref 3.0–12.0)
Platelets: 303 10*3/uL (ref 150.0–400.0)
RDW: 14.1 % (ref 11.5–14.6)
WBC: 7 10*3/uL (ref 4.5–10.5)

## 2011-04-18 LAB — LIPID PANEL
LDL Cholesterol: 88 mg/dL (ref 0–99)
Total CHOL/HDL Ratio: 2
VLDL: 11.4 mg/dL (ref 0.0–40.0)

## 2011-04-18 LAB — HEPATIC FUNCTION PANEL
ALT: 16 U/L (ref 0–35)
AST: 20 U/L (ref 0–37)
Alkaline Phosphatase: 67 U/L (ref 39–117)
Bilirubin, Direct: 0.1 mg/dL (ref 0.0–0.3)
Total Bilirubin: 0.8 mg/dL (ref 0.3–1.2)

## 2011-04-18 LAB — BASIC METABOLIC PANEL
BUN: 12 mg/dL (ref 6–23)
Calcium: 9.6 mg/dL (ref 8.4–10.5)
GFR: 85.24 mL/min (ref >60.00)
Glucose, Bld: 99 mg/dL (ref 70–99)
Potassium: 4.1 mEq/L (ref 3.5–5.1)
Sodium: 141 mEq/L (ref 135–145)

## 2011-04-18 LAB — TSH: TSH: 9.31 u[IU]/mL — ABNORMAL HIGH (ref 0.35–5.50)

## 2011-04-18 LAB — HEMOGLOBIN A1C: Hgb A1c MFr Bld: 5.9 % (ref 4.6–6.5)

## 2011-04-22 MED ORDER — LEVOTHYROXINE SODIUM 50 MCG PO TABS: 50.0000 ug | ORAL_TABLET | ORAL | Status: DC

## 2011-04-22 NOTE — Progress Notes (Signed)
Addended by: Stephan Minister on: 04/22/2011 08:23 AM   Modules accepted: Orders

## 2011-04-25 ENCOUNTER — Other Ambulatory Visit (INDEPENDENT_AMBULATORY_CARE_PROVIDER_SITE_OTHER): Payer: BC Managed Care – PPO

## 2011-04-25 DIAGNOSIS — Z1211 Encounter for screening for malignant neoplasm of colon: Secondary | ICD-10-CM

## 2011-04-25 LAB — HEMOCCULT GUIAC POC 1CARD (OFFICE)

## 2011-05-01 ENCOUNTER — Other Ambulatory Visit: Payer: Self-pay

## 2011-05-01 MED ORDER — LOSARTAN POTASSIUM 100 MG PO TABS: 100.0000 mg | ORAL_TABLET | Freq: Every day | ORAL | Status: DC

## 2011-05-01 MED ORDER — LORAZEPAM 1 MG PO TABS: 1.0000 mg | ORAL_TABLET | ORAL | Status: DC

## 2011-05-10 NOTE — Op Note (Signed)
   NAME:  Brandi Todd, Brandi Todd NO.:  [id number]   MEDICAL RECORD NO.:  [id number]                   PATIENT TYPE:  OUT   LOCATION:  XRAY                                 FACILITY:  [facility]   PHYSICIAN:  Lucky Cowboy, M.D.                    DATE OF BIRTH:  [birth year]   DATE OF PROCEDURE:  DATE OF DISCHARGE:  08/01/2003                                 OPERATIVE REPORT   PREOPERATIVE DIAGNOSIS:  Left submandibular abscess.   POSTOPERATIVE DIAGNOSIS:  Left submandibular abscess.   OPERATION PERFORMED:  Incision and drainage, right floor of mouth abscess.   SURGEON:  Lucky Cowboy, M.D.   ANESTHESIA:  General endotracheal.   ESTIMATED BLOOD LOSS:  20mL.   SPECIMENS:  Culture of right floor of mouth abscess.   COMPLICATIONS:  None.   INDICATIONS FOR PROCEDURE:  The patient is a 60 year old female, who has  been previously on oral antibiotics with progressive swelling in the right  floor of mouth and submandibular triangle.  CT scan revealed an abscess.  This was adjacent to the body of the medial portion of the mandible.  For  these reasons, the patient is admitted for incision and drainage.   DESCRIPTION OF PROCEDURE:  The patient was taken to the operating room and  placed on the table in the supine position.  She was then orally intubated  awake and then placed under general anesthesia.  A bite block was placed  between the left molars.  The tongue was retracted laterally.  Palpation  down along the medial border of the mandible revealed a fluctuant area.  A  15 blade was used to make a 1 cm incision staying adjacent to the mandible.  Hemostat was used to enter the abscess cavity.  Pus was evacuated and  culture obtained. The area was then copiously irrigated with normal saline.  A drain could not be secured in place and for this reason it was left open  to the floor of the mouth.  Oral cavity was suctioned and bite block  removed.  The patient was extubated  in the operating room and taken to the  post anesthesia care unit in stable condition.  There were no complications.                                                Lucky Cowboy, M.D.    SJ/MEDQ  D:  08/18/2003  T:  08/19/2003  Job:  [id number]

## 2011-07-10 ENCOUNTER — Other Ambulatory Visit: Payer: Self-pay | Admitting: Internal Medicine

## 2011-07-10 DIAGNOSIS — E039 Hypothyroidism, unspecified: Secondary | ICD-10-CM

## 2011-07-11 ENCOUNTER — Other Ambulatory Visit (INDEPENDENT_AMBULATORY_CARE_PROVIDER_SITE_OTHER): Payer: BC Managed Care – PPO

## 2011-07-11 DIAGNOSIS — E039 Hypothyroidism, unspecified: Secondary | ICD-10-CM

## 2011-07-11 LAB — TSH: TSH: 0.45 u[IU]/mL (ref 0.35–5.50)

## 2011-07-11 NOTE — Progress Notes (Signed)
Labs only

## 2011-07-23 ENCOUNTER — Encounter: Payer: Self-pay | Admitting: Internal Medicine

## 2011-07-23 ENCOUNTER — Ambulatory Visit (INDEPENDENT_AMBULATORY_CARE_PROVIDER_SITE_OTHER): Payer: BC Managed Care – PPO | Admitting: Internal Medicine

## 2011-07-23 VITALS — BP 130/88 | HR 89 | Wt 154.2 lb

## 2011-07-23 DIAGNOSIS — E119 Type 2 diabetes mellitus without complications: Secondary | ICD-10-CM

## 2011-07-23 DIAGNOSIS — E039 Hypothyroidism, unspecified: Secondary | ICD-10-CM

## 2011-07-23 MED ORDER — LEVOTHYROXINE SODIUM 50 MCG PO TABS: 50.0000 ug | ORAL_TABLET | ORAL | Status: DC

## 2011-07-23 NOTE — Progress Notes (Signed)
  Subjective:    Patient ID: Brandi Todd, female    DOB: Oct 17, 1951, 60 y.o.   MRN: [id number]  HPI  Thyroid function monitor : Medications status(change in dose/brand/mode of administration):no Constitutional: Weight : stable; Fatigue:no; Sleep pattern:no; Appetite:no  Visual change(blurred/diplopia/visual loss):no; S/P cataract sugery Hoarseness:no; Swallowing issues:no Cardiovascular: Palpitations:no; Racing:no; Irregularity:no GI: Constipation:no; Diarrhea:no Derm: Change in nails/hair/skin:no ( nails previously brittle) Neuro: Numbness/tingling: no; Tremor:no Psych: Anxiety:no; Depression:no; Panic attacks:no Endo: Temperature intolerance: Heat:no; Cold:no  TSH 0.45       Review of Systems     Objective:   Physical Exam Gen.:  well-nourished; in no acute distress Eyes: Extraocular motion intact; no lid lag or proptosis Neck:  No deformities, thyromegaly, masses, or tenderness noted.   Supple with full range of motion without pain.  Heart: Normal rhythm and rate without significant murmur, gallop. S4 Lungs: Chest clear to auscultation without rales,rales, wheezes Neuro:Deep tendon reflexes are equal and within normal limits; no tremor  Skin: Warm and dry without significant lesions or rashes; no onycholysis Psych: Normally communicative and interactive; no abnormal mood or affect clinically.         Assessment & Plan:    #1 hypothyroidism, slight overcorrection.  Plan: Decrease thyroid to 50 mcg daily except half a pill on Wednesday. Recheck TSH in 3 months. Goal equals TSH of 1 to 3

## 2011-07-23 NOTE — Assessment & Plan Note (Signed)
A1c  5.9% in 03/2011 on NO meds

## 2011-07-23 NOTE — Patient Instructions (Signed)
Check TSH in 3 months (244.9)

## 2011-07-25 ENCOUNTER — Other Ambulatory Visit: Payer: Self-pay | Admitting: Internal Medicine

## 2011-07-25 MED ORDER — LEVOTHYROXINE SODIUM 50 MCG PO TABS: 50.0000 ug | ORAL_TABLET | ORAL | Status: DC

## 2011-07-25 NOTE — Telephone Encounter (Signed)
done

## 2011-10-09 ENCOUNTER — Other Ambulatory Visit: Payer: Self-pay | Admitting: Internal Medicine

## 2011-10-09 DIAGNOSIS — E039 Hypothyroidism, unspecified: Secondary | ICD-10-CM

## 2011-10-10 ENCOUNTER — Other Ambulatory Visit (INDEPENDENT_AMBULATORY_CARE_PROVIDER_SITE_OTHER): Payer: BC Managed Care – PPO

## 2011-10-10 DIAGNOSIS — E039 Hypothyroidism, unspecified: Secondary | ICD-10-CM

## 2011-10-10 LAB — TSH: TSH: 1.52 u[IU]/mL (ref 0.35–5.50)

## 2011-10-10 NOTE — Progress Notes (Signed)
Labs only

## 2011-10-29 ENCOUNTER — Other Ambulatory Visit: Payer: Self-pay | Admitting: Internal Medicine

## 2011-10-29 MED ORDER — LORAZEPAM 1 MG PO TABS: 1.0000 mg | ORAL_TABLET | ORAL | Status: DC

## 2011-10-29 NOTE — Telephone Encounter (Signed)
Rx sent 

## 2012-04-24 ENCOUNTER — Telehealth: Payer: Self-pay | Admitting: Internal Medicine

## 2012-04-24 MED ORDER — LOSARTAN POTASSIUM 100 MG PO TABS: 100.0000 mg | ORAL_TABLET | Freq: Every day | ORAL | Status: DC

## 2012-04-24 NOTE — Telephone Encounter (Signed)
RX sent

## 2012-04-24 NOTE — Telephone Encounter (Signed)
Refill: Losartan potassium 100mg  tab #30. Take 1 tablet by mouth every day. Last fill 03-26-12

## 2012-04-27 ENCOUNTER — Other Ambulatory Visit: Payer: Self-pay | Admitting: Internal Medicine

## 2012-05-25 ENCOUNTER — Ambulatory Visit (INDEPENDENT_AMBULATORY_CARE_PROVIDER_SITE_OTHER): Payer: 59 | Admitting: Internal Medicine

## 2012-05-25 ENCOUNTER — Encounter: Payer: Self-pay | Admitting: Internal Medicine

## 2012-05-25 VITALS — BP 136/84 | HR 83 | Temp 97.9°F | Resp 14 | Ht 67.03 in | Wt 154.2 lb

## 2012-05-25 DIAGNOSIS — E119 Type 2 diabetes mellitus without complications: Secondary | ICD-10-CM

## 2012-05-25 DIAGNOSIS — E039 Hypothyroidism, unspecified: Secondary | ICD-10-CM

## 2012-05-25 DIAGNOSIS — Z0000 Encounter for general adult medical examination without abnormal findings: Secondary | ICD-10-CM

## 2012-05-25 DIAGNOSIS — R9431 Abnormal electrocardiogram [ECG] [EKG]: Secondary | ICD-10-CM | POA: Insufficient documentation

## 2012-05-25 LAB — CBC WITH DIFFERENTIAL/PLATELET
Basophils Relative: 0.3 % (ref 0.0–3.0)
Eosinophils Relative: 2.6 % (ref 0.0–5.0)
Hemoglobin: 14.1 g/dL (ref 12.0–15.0)
MCV: 97.8 fl (ref 78.0–100.0)
Monocytes Absolute: 0.7 10*3/uL (ref 0.1–1.0)
Neutro Abs: 4.5 10*3/uL (ref 1.4–7.7)
Neutrophils Relative %: 68.1 % (ref 43.0–77.0)
RBC: 4.25 Mil/uL (ref 3.87–5.11)
WBC: 6.6 10*3/uL (ref 4.5–10.5)

## 2012-05-25 LAB — HEPATIC FUNCTION PANEL
ALT: 19 U/L (ref 0–35)
Alkaline Phosphatase: 60 U/L (ref 39–117)
Bilirubin, Direct: 0.1 mg/dL (ref 0.0–0.3)
Total Protein: 6.9 g/dL (ref 6.0–8.3)

## 2012-05-25 LAB — LIPID PANEL
Total CHOL/HDL Ratio: 2
Triglycerides: 107 mg/dL (ref 0.0–149.0)

## 2012-05-25 LAB — BASIC METABOLIC PANEL
Chloride: 104 mEq/L (ref 96–112)
Creatinine, Ser: 0.6 mg/dL (ref 0.4–1.2)
Potassium: 3.6 mEq/L (ref 3.5–5.1)
Sodium: 138 mEq/L (ref 135–145)

## 2012-05-25 LAB — TSH: TSH: 1.03 u[IU]/mL (ref 0.35–5.50)

## 2012-05-25 LAB — MICROALBUMIN / CREATININE URINE RATIO
Creatinine,U: 19.7 mg/dL
Microalb, Ur: 0.4 mg/dL (ref 0.0–1.9)

## 2012-05-25 MED ORDER — LORAZEPAM 1 MG PO TABS: 1.0000 mg | ORAL_TABLET | ORAL | Status: DC

## 2012-05-25 MED ORDER — LOSARTAN POTASSIUM 100 MG PO TABS: 100.0000 mg | ORAL_TABLET | Freq: Every day | ORAL | Status: DC

## 2012-05-25 NOTE — Progress Notes (Signed)
  Subjective:    Patient ID: Brandi Todd, female    DOB: 1950/12/30, 61 y.o.   MRN: [id number]  HPI  She is  is here for a physical; she has no acute issues .      Review of Systems HYPERTENSION: Disease Monitoring: Blood pressure range-not monitored @ home  Chest pain, palpitations-no       Dyspnea- no Medications: Compliance- yes  Lightheadedness,Syncope- no    Edema- no  DIABETES: Disease Monitoring: Blood Sugar ranges-no  Polyuria/phagia/dipsia- no       Visual problems- no Medications: Compliance- TLC only  Hypoglycemic symptoms- no  Abd pain, bowel changes- no but "gurgling" .She has never had a colonoscopy; the standard of care was reviewed and can. Muscle aches- no       Objective:   Physical Exam Gen.: Healthy and well-nourished in appearance. Alert, appropriate and cooperative throughout exam. Head: Normocephalic without obvious abnormalities  Eyes: No corneal or conjunctival inflammation noted. Pupils equal round reactive to light and accommodation. Fundal exam is benign without hemorrhages, exudate, papilledema. Extraocular motion intact. Vision grossly normal. Ears: External  ear exam reveals no significant lesions or deformities. Canals clear .TMs normal. Hearing is grossly normal bilaterally. Nose: External nasal exam reveals no deformity or inflammation. Nasal mucosa are pink and moist. No lesions or exudates noted.  Mouth: Oral mucosa and oropharynx reveal no lesions or exudates. Teeth in very poor repair; caries present. Osteoma hard palate Neck: No deformities, masses, or tenderness noted. Range of motion & Thyroid normal Lungs: Normal respiratory effort; chest expands symmetrically. Lungs are clear to auscultation without rales, wheezes, or increased work of breathing.Breath sounds decreased Heart: Normal rate and rhythm. Normal S1 and S2. No gallop, click, or rub. S4 w/o  murmur. Occasional premature beat Abdomen: Bowel sounds normal; abdomen soft  and nontender. No masses, organomegaly or hernias noted. Genitalia: Gyn follow up recommended                                                      Musculoskeletal/extremities: No deformity or scoliosis noted of  the thoracic or lumbar spine. No clubbing, cyanosis, edema, or deformity noted. Range of motion  normal .Tone & strength  normal.Joints normal. Nail health  good. Vascular: Carotid, radial artery, dorsalis pedis and  posterior tibial pulses are full and equal. No bruits present. Neurologic: Alert and oriented x3. Deep tendon reflexes symmetrical and normal.          Skin: Intact without suspicious lesions or rashes. Lymph: No cervical, axillary lymphadenopathy present. Psych: Mood and affect are normal. Normally interactive                                                                                         Assessment & Plan:  #1 comprehensive physical exam; no acute findings #2 see Problem List with Assessments & Recommendations Plan: see Orders

## 2012-05-25 NOTE — Patient Instructions (Addendum)
Preventive Health Care: Exercise  30-45  minutes a day, 3-4 days a week. Walking is especially valuable in preventing Osteoporosis. Eat a low-fat diet with lots of fruits and vegetables, up to 7-9 servings per day. Consume less than 30 grams of sugar per day from foods & drinks with High Fructose Corn Syrup as #1 ,2,3 or #4 on label. Please see your Dentist to address caries.  Health Care Power of Attorney & Living Will place you in charge of your health care  decisions. Verify these are  in place.  As per the Standard of Care , screening Colonoscopy recommended @ 50 & every 5-10 years thereafter . More frequent monitor would be dictated by family history or findings @ Colonoscopy. Blood Pressure Goal  Ideally is an AVERAGE < 135/85. This AVERAGE should be calculated from @ least 5-7 BP readings taken @ different times of day on different days of week. You should not respond to isolated BP readings , but rather the AVERAGE for that week . To prevent palpitations or premature beats, avoid stimulants such as decongestants, diet pills, nicotine, or caffeine (coffee, tea, cola, or chocolate) to excess. Consider  [facility]'s smoking cessation program @ www.[facility].com or 712-020-8766.  Please try to go on My Chart within the next 24 hours to allow me to release the results directly to you.

## 2012-06-02 ENCOUNTER — Encounter: Payer: Self-pay | Admitting: *Deleted

## 2012-06-25 ENCOUNTER — Other Ambulatory Visit: Payer: Self-pay | Admitting: Internal Medicine

## 2012-08-07 ENCOUNTER — Other Ambulatory Visit: Payer: Self-pay | Admitting: Internal Medicine

## 2012-11-16 ENCOUNTER — Other Ambulatory Visit: Payer: Self-pay | Admitting: Internal Medicine

## 2012-11-18 ENCOUNTER — Other Ambulatory Visit: Payer: Self-pay | Admitting: Internal Medicine

## 2012-11-26 ENCOUNTER — Telehealth: Payer: Self-pay | Admitting: *Deleted

## 2012-11-26 NOTE — Telephone Encounter (Signed)
erreo

## 2013-05-19 ENCOUNTER — Other Ambulatory Visit: Payer: Self-pay | Admitting: Internal Medicine

## 2013-05-19 NOTE — Telephone Encounter (Signed)
Spoke with patient, patient states she will wait to sign contract in July when she comes in. Patient is ok to wait until then for her rx. Patient aware rx will be denied for now

## 2013-05-28 ENCOUNTER — Encounter: Payer: 59 | Admitting: Internal Medicine

## 2013-06-17 ENCOUNTER — Other Ambulatory Visit: Payer: Self-pay | Admitting: Internal Medicine

## 2013-06-17 NOTE — Telephone Encounter (Signed)
Refill done for 1 month per protocol. Pt. Scheduled for future appointment 07/07/13.

## 2013-07-07 ENCOUNTER — Encounter: Payer: 59 | Admitting: Internal Medicine

## 2013-07-07 ENCOUNTER — Ambulatory Visit (INDEPENDENT_AMBULATORY_CARE_PROVIDER_SITE_OTHER): Payer: 59 | Admitting: Internal Medicine

## 2013-07-07 ENCOUNTER — Encounter: Payer: Self-pay | Admitting: Internal Medicine

## 2013-07-07 VITALS — BP 140/88 | HR 93 | Temp 98.2°F | Resp 12 | Ht 66.0 in | Wt 164.8 lb

## 2013-07-07 DIAGNOSIS — E039 Hypothyroidism, unspecified: Secondary | ICD-10-CM

## 2013-07-07 DIAGNOSIS — Z0000 Encounter for general adult medical examination without abnormal findings: Secondary | ICD-10-CM

## 2013-07-07 DIAGNOSIS — F17200 Nicotine dependence, unspecified, uncomplicated: Secondary | ICD-10-CM

## 2013-07-07 DIAGNOSIS — I10 Essential (primary) hypertension: Secondary | ICD-10-CM

## 2013-07-07 LAB — BASIC METABOLIC PANEL
CO2: 26 mEq/L (ref 19–32)
Calcium: 9.8 mg/dL (ref 8.4–10.5)
Creatinine, Ser: 0.8 mg/dL (ref 0.4–1.2)
Glucose, Bld: 98 mg/dL (ref 70–99)

## 2013-07-07 MED ORDER — NEOMYCIN-POLYMYXIN-HC 3.5-10000-1 OT SOLN: 3.0000 [drp] | Freq: Four times a day (QID) | OTIC | Status: DC

## 2013-07-07 MED ORDER — LOSARTAN POTASSIUM 100 MG PO TABS: 100.0000 mg | ORAL_TABLET | Freq: Every day | ORAL | Status: AC

## 2013-07-07 MED ORDER — LEVOTHYROXINE SODIUM 50 MCG PO TABS: 50.0000 ug | ORAL_TABLET | ORAL | Status: DC

## 2013-07-07 MED ORDER — LORAZEPAM 1 MG PO TABS: ORAL_TABLET | ORAL | Status: AC

## 2013-07-07 NOTE — Progress Notes (Signed)
  Subjective:    Patient ID: Brandi Todd, female    DOB: August 02, 1951, 62 y.o.   MRN: [id number]  HPI  She is here for a physical;acute issues include R ear congestion for 3 weeks w/o pain , tinnitus or hearing loss.     Review of Systems CHRONIC HYPERTENSION follow-up:  Home blood pressure not monitored @ home  Patient is compliant with medications  No adverse effects noted from medication  Exercise program as walking >5  times per week for 45-60 minutes  Low  salt diet    No chest pain, palpitations, dyspnea, claudication,edema or paroxysmal nocturnal dyspnea described  No significant lightheadedness, headache, epistaxis, or syncope       Objective:   Physical Exam Gen.:  well-nourished in appearance. Alert, appropriate and cooperative throughout exam. Head: Normocephalic without obvious abnormalities Eyes: No corneal or conjunctival inflammation noted.  Extraocular motion intact. Vision grossly normal without lenses Ears: External  ear exam reveals no significant lesions or deformities. Canals clear .R TM with mucoid changes posteriorly. Hearing is grossly normal bilaterally. Nose: External nasal exam reveals no deformity or inflammation. Nasal mucosa are pink and moist. No lesions or exudates noted.  Mouth: Oral mucosa and oropharynx reveal no exudates. Osteoma of hard palate. Teeth in fair to poor repair. Neck: No deformities, masses, or tenderness noted. Range of motion &Thyroid normal. Lungs: Normal respiratory effort; chest expands symmetrically. Lungs are clear to auscultation without rales, wheezes, or increased work of breathing. Heart: Normal rate and rhythm. Normal S1 and S2. No gallop, click, or rub. S4 w/o murmur. Abdomen: Bowel sounds normal; abdomen soft and nontender. No masses, organomegaly or hernias noted. Genitalia: As per Gyn                                  Musculoskeletal/extremities: No deformity or scoliosis noted of  the thoracic or lumbar  spine.  No clubbing, cyanosis, edema, or significant extremity  deformity noted. Range of motion normal .Tone & strength  Normal. Joints normal . Nail health good. Crepitus knees Able to lie down & sit up w/o help. Negative SLR bilaterally Vascular: Carotid, radial artery, dorsalis pedis and  posterior tibial pulses are full and equal. No bruits present. Neurologic: Alert and oriented x3. Deep tendon reflexes symmetrical and normal.         Skin: Intact without suspicious lesions or rashes. Lymph: No cervical, axillary lymphadenopathy present. Psych: Mood and affect are normal. Normally interactive                                                                                        Assessment & Plan:  #1 comprehensive physical exam; no acute findings #2 mucinous changes R TM  Plan: see Orders  & Recommendations

## 2013-07-07 NOTE — Patient Instructions (Addendum)

## 2013-07-09 ENCOUNTER — Encounter: Payer: Self-pay | Admitting: *Deleted

## 2013-07-21 ENCOUNTER — Other Ambulatory Visit: Payer: Self-pay | Admitting: Internal Medicine

## 2013-08-22 ENCOUNTER — Other Ambulatory Visit: Payer: Self-pay | Admitting: Internal Medicine

## 2013-08-25 NOTE — Telephone Encounter (Signed)
Med filled.  

## 2013-11-01 ENCOUNTER — Other Ambulatory Visit: Payer: Self-pay | Admitting: Internal Medicine

## 2013-11-01 NOTE — Telephone Encounter (Signed)
Neomycin-polymyxin-hydrocortisone otic solution sent to pharmacy

## 2014-09-19 ENCOUNTER — Other Ambulatory Visit: Payer: Self-pay

## 2014-09-19 MED ORDER — LEVOTHYROXINE SODIUM 50 MCG PO TABS: ORAL_TABLET | ORAL | Status: AC

## 2014-10-20 ENCOUNTER — Other Ambulatory Visit: Payer: Self-pay | Admitting: Internal Medicine

## 2014-10-24 ENCOUNTER — Other Ambulatory Visit: Payer: Self-pay | Admitting: Internal Medicine

## 2014-10-24 ENCOUNTER — Encounter: Payer: Self-pay | Admitting: Internal Medicine

## 2020-04-10 ENCOUNTER — Telehealth: Payer: Self-pay

## 2020-04-10 NOTE — Telephone Encounter (Signed)
error
